# Patient Record
Sex: Male | Born: 1980 | Race: Black or African American | Hispanic: No | Marital: Single | Smoking: Never smoker
Health system: Southern US, Community
[De-identification: ages and names within clinical notes are randomized; demographics above are authoritative.]

## PROBLEM LIST (undated history)

## (undated) DIAGNOSIS — I10 Essential (primary) hypertension: Secondary | ICD-10-CM

## (undated) HISTORY — PX: HERNIA REPAIR: SHX51

---

## 2015-05-09 ENCOUNTER — Emergency Department: Payer: Self-pay

## 2015-05-09 ENCOUNTER — Encounter: Payer: Self-pay | Admitting: *Deleted

## 2015-05-09 ENCOUNTER — Other Ambulatory Visit: Payer: Self-pay

## 2015-05-09 ENCOUNTER — Emergency Department
Admission: EM | Admit: 2015-05-09 | Discharge: 2015-05-09 | Disposition: A | Discharge status: Home / Self Care | Payer: Self-pay | Attending: Student | Admitting: Student

## 2015-05-09 DIAGNOSIS — I10 Essential (primary) hypertension: Secondary | ICD-10-CM | POA: Insufficient documentation

## 2015-05-09 HISTORY — DX: Essential (primary) hypertension: I10

## 2015-05-09 LAB — URINALYSIS COMPLETE WITH MICROSCOPIC (ARMC ONLY)
BACTERIA UA: NONE SEEN
Bilirubin Urine: NEGATIVE
Glucose, UA: NEGATIVE mg/dL
Hgb urine dipstick: NEGATIVE
Leukocytes, UA: NEGATIVE
NITRITE: NEGATIVE
PH: 7 (ref 5.0–8.0)
Protein, ur: NEGATIVE mg/dL
Specific Gravity, Urine: 1.008 (ref 1.005–1.030)
Squamous Epithelial / LPF: NONE SEEN
WBC UA: NONE SEEN WBC/hpf (ref 0–5)

## 2015-05-09 LAB — CBC
HEMATOCRIT: 48 % (ref 40.0–52.0)
HEMOGLOBIN: 14.8 g/dL (ref 13.0–18.0)
MCH: 22.3 pg — ABNORMAL LOW (ref 26.0–34.0)
MCHC: 30.9 g/dL — ABNORMAL LOW (ref 32.0–36.0)
MCV: 72.2 fL — AB (ref 80.0–100.0)
Platelets: 125 10*3/uL — ABNORMAL LOW (ref 150–440)
RBC: 6.64 MIL/uL — ABNORMAL HIGH (ref 4.40–5.90)
RDW: 14.8 % — ABNORMAL HIGH (ref 11.5–14.5)
WBC: 9 10*3/uL (ref 3.8–10.6)

## 2015-05-09 LAB — BASIC METABOLIC PANEL
Anion gap: 8 (ref 5–15)
BUN: 18 mg/dL (ref 6–20)
CALCIUM: 9.6 mg/dL (ref 8.9–10.3)
CHLORIDE: 102 mmol/L (ref 101–111)
CO2: 28 mmol/L (ref 22–32)
CREATININE: 1.38 mg/dL — AB (ref 0.61–1.24)
GFR calc Af Amer: >60 mL/min (ref >60)
Glucose, Bld: 83 mg/dL (ref 65–99)
Potassium: 3.5 mmol/L (ref 3.5–5.1)
Sodium: 138 mmol/L (ref 135–145)

## 2015-05-09 LAB — TROPONIN I: Troponin I: <0.03 ng/mL (ref <0.031)

## 2015-05-09 MED ORDER — HYDROCHLOROTHIAZIDE 12.5 MG PO CAPS: 25.0000 mg | ORAL_CAPSULE | Freq: Every day | ORAL | Status: AC

## 2015-05-09 MED ORDER — HYDRALAZINE HCL 20 MG/ML IJ SOLN
INTRAMUSCULAR | Status: AC
  Filled 2015-05-09: qty 1

## 2015-05-09 MED ORDER — LABETALOL HCL 5 MG/ML IV SOLN
10.0000 mg | Freq: Once | INTRAVENOUS | Status: AC
  Administered 2015-05-09: 10 mg via INTRAVENOUS

## 2015-05-09 MED ORDER — LABETALOL HCL 5 MG/ML IV SOLN
INTRAVENOUS | Status: AC
  Administered 2015-05-09: 10 mg via INTRAVENOUS
  Filled 2015-05-09: qty 4

## 2015-05-09 MED ORDER — HYDRALAZINE HCL 20 MG/ML IJ SOLN: 10.0000 mg | Freq: Once | INTRAMUSCULAR | Status: AC

## 2015-05-09 NOTE — ED Notes (Addendum)
Pt reports he took his blood pressure this morning and it was high (170/120's). Pt denies dizziness, headache, or pain. Pt has hx of hbp but has been off his meds since 2013, stating he was trying to loose weight instead of taking medications.

## 2015-05-09 NOTE — ED Notes (Signed)
Pt resting in bed with no complaints.

## 2015-05-09 NOTE — ED Provider Notes (Signed)
[facility] Emergency Department Provider Note  ____________________________________________  Time seen: Approximately 4:48 PM  I have reviewed the triage vital signs and the nursing notes.   HISTORY  Chief Complaint Hypertension    HPI Verna Hamon is a 34 y.o. male with history of hypertension though he has not been compliant with his antihypertensive medications for several years who presents for evaluation of hypertension. Patient reports that today he was in his usual state of health. There was a blood pressure screening in the cafeteria which he attended and he was told his blood pressure was severely elevated and referred to the emergency department. Currently he reports that he feels well. He denies any current chest pain, difficulty breathing, nausea, vomiting, headache, numbness or weakness, no vision change. He does report that earlier today he had "gas pains close to where my heart is". This was not exacerbated by movement/activity or exertion, not associated with any shortness of breath, nausea, sudden sweating. No modifying factors. Current severity is moderate. He has been hypertensive for several years but to what degree, he is not sure.   Past Medical History  Diagnosis Date  . Hypertension     There are no active problems to display for this patient.   Past Surgical History  Procedure Laterality Date  . Hernia repair      No current outpatient prescriptions on file.  Allergies Review of patient's allergies indicates no known allergies.  No family history on file.  Social History History  Substance Use Topics  . Smoking status: Never Smoker   . Smokeless tobacco: Not on file  . Alcohol Use: No    Review of Systems Constitutional: No fever/chills Eyes: No visual changes. ENT: No sore throat. Cardiovascular: Denies chest pain. Respiratory: Denies shortness of breath. Gastrointestinal: No abdominal pain.  No nausea, no vomiting.   No diarrhea.  No constipation. Genitourinary: Negative for dysuria. Musculoskeletal: Negative for back pain. Skin: Negative for rash. Neurological: Negative for headaches, focal weakness or numbness.  10-point ROS otherwise negative.  ____________________________________________   PHYSICAL EXAM:  VITAL SIGNS: ED Triage Vitals  Enc Vitals Group     BP 05/09/15 1633 189/128 mmHg     Pulse Rate 05/09/15 1633 82     Resp 05/09/15 1633 18     Temp 05/09/15 1633 98.1 F (36.7 C)     Temp Source 05/09/15 1633 Oral     SpO2 05/09/15 1633 100 %     Weight 05/09/15 1633 272 lb (123.378 kg)     Height 05/09/15 1633  (1.803 m)     Head Cir --      Peak Flow --      Pain Score --      Pain Loc --      Pain Edu? --      Excl. in GC? --     Constitutional: Alert and oriented. Well appearing and in no acute distress. Eyes: Conjunctivae are normal. PERRL. EOMI. Head: Atraumatic. Nose: No congestion/rhinnorhea. Mouth/Throat: Mucous membranes are moist.  Oropharynx non-erythematous. Neck: No stridor.  Cardiovascular: Normal rate, regular rhythm. Grossly normal heart sounds.  Good peripheral circulation. Respiratory: Normal respiratory effort.  No retractions. Lungs CTAB. Gastrointestinal: Soft and nontender. No distention. No abdominal bruits. No CVA tenderness. Genitourinary: deferred Musculoskeletal: No lower extremity tenderness nor edema.  No joint effusions. Neurologic:  Normal speech and language. No gross focal neurologic deficits are appreciated. Speech is normal. No gait instability. 5 out of 5 strength in  bilateral upper and lower extremities. Sensation intact to light touch throughout. Cranial nerves II through XII intact. Intact finger-nose-finger. Skin:  Skin is warm, dry and intact. No rash noted. Psychiatric: Mood and affect are normal. Speech and behavior are normal.  ____________________________________________   LABS (all labs ordered are listed, but only  abnormal results are displayed)  Labs Reviewed  CBC - Abnormal; Notable for the following:    RBC 6.64 (*)    MCV 72.2 (*)    MCH 22.3 (*)    MCHC 30.9 (*)    RDW 14.8 (*)    Platelets 125 (*)    All other components within normal limits  BASIC METABOLIC PANEL  URINALYSIS COMPLETEWITH MICROSCOPIC ([facility] ONLY)  TROPONIN I   ____________________________________________  EKG  ED ECG REPORT I, Gayla DossGayle, Serenidy Waltz A, the attending physician, personally viewed and interpreted this ECG.   Date: 05/09/2015  EKG Time: 16:45  Rate: 75  Rhythm: Sinus rhythm 1st degree AV block  Axis: Normal  Intervals:none  ST&T Change: No acute ST segment change  ____________________________________________  RADIOLOGY  CXR IMPRESSION: Borderline cardiomegaly without acute cardiopulmonary disease on this AP portable examination. Further evaluation with a PA and lateral chest radiograph may be obtained as clinically indicated.  ____________________________________________   PROCEDURES  Procedure(s) performed: None  Critical Care performed: No  ____________________________________________   INITIAL IMPRESSION / ASSESSMENT AND PLAN / ED COURSE  Pertinent labs & imaging results that were available during my care of the patient were reviewed by me and considered in my medical decision making (see chart for details).  Melinda CrutchJames Shearon is a 34 y.o. male with history of hypertension though he has not been compliant with his antihypertensive medications for several years who presents for evaluation of hypertension, currently asymptomatic. On exam, he is very well-appearing. He is sitting up in bed, talkative, in no distress. He is severely hypertensive on arrival. We will obtain screening labs, chest x-ray, EKG, and give hydralazine to reduce his blood pressure. Reassess for disposition.  ----------------------------------------- 7:42 PM on 05/09/2015 ----------------------------------------- Troponin  negative. EKG reassuring. Chest pain is not consistent with ACS. No evidence of end organ dysfunction in terms of interstitial edema. Creatinine is 1.38, baseline unknown. Blood pressure has improved to 164/108. Will start on HCTZ. Discussed need for medication compliance and close PCP follow-up. He will find a primary care doctor in his area. DC with return precautions.  ____________________________________________   FINAL CLINICAL IMPRESSION(S) / ED DIAGNOSES  Final diagnoses:  Essential hypertension      Gayla DossEryka A Fleming Prill, MD 05/09/15 [phone]

## 2015-05-09 NOTE — Discharge Instructions (Signed)
Return immediately to the emergency department if you develop recurrent/severe/worsening chest pain, difficulty breathing, sudden sweating, nausea or vomiting, fevers, headache, numbness or weakness, confusion, vision changes, speech difficulties or for any other concerns.

## 2016-01-24 IMAGING — CR DG CHEST 1V
1 series · 1 of 1 positions shown · non-contrast
Comparison: None.

CLINICAL DATA: Mid to left-sided chest pain which radiates to the
neck. Elevated blood pressure.

EXAM:
CHEST  1 VIEW

[portable]
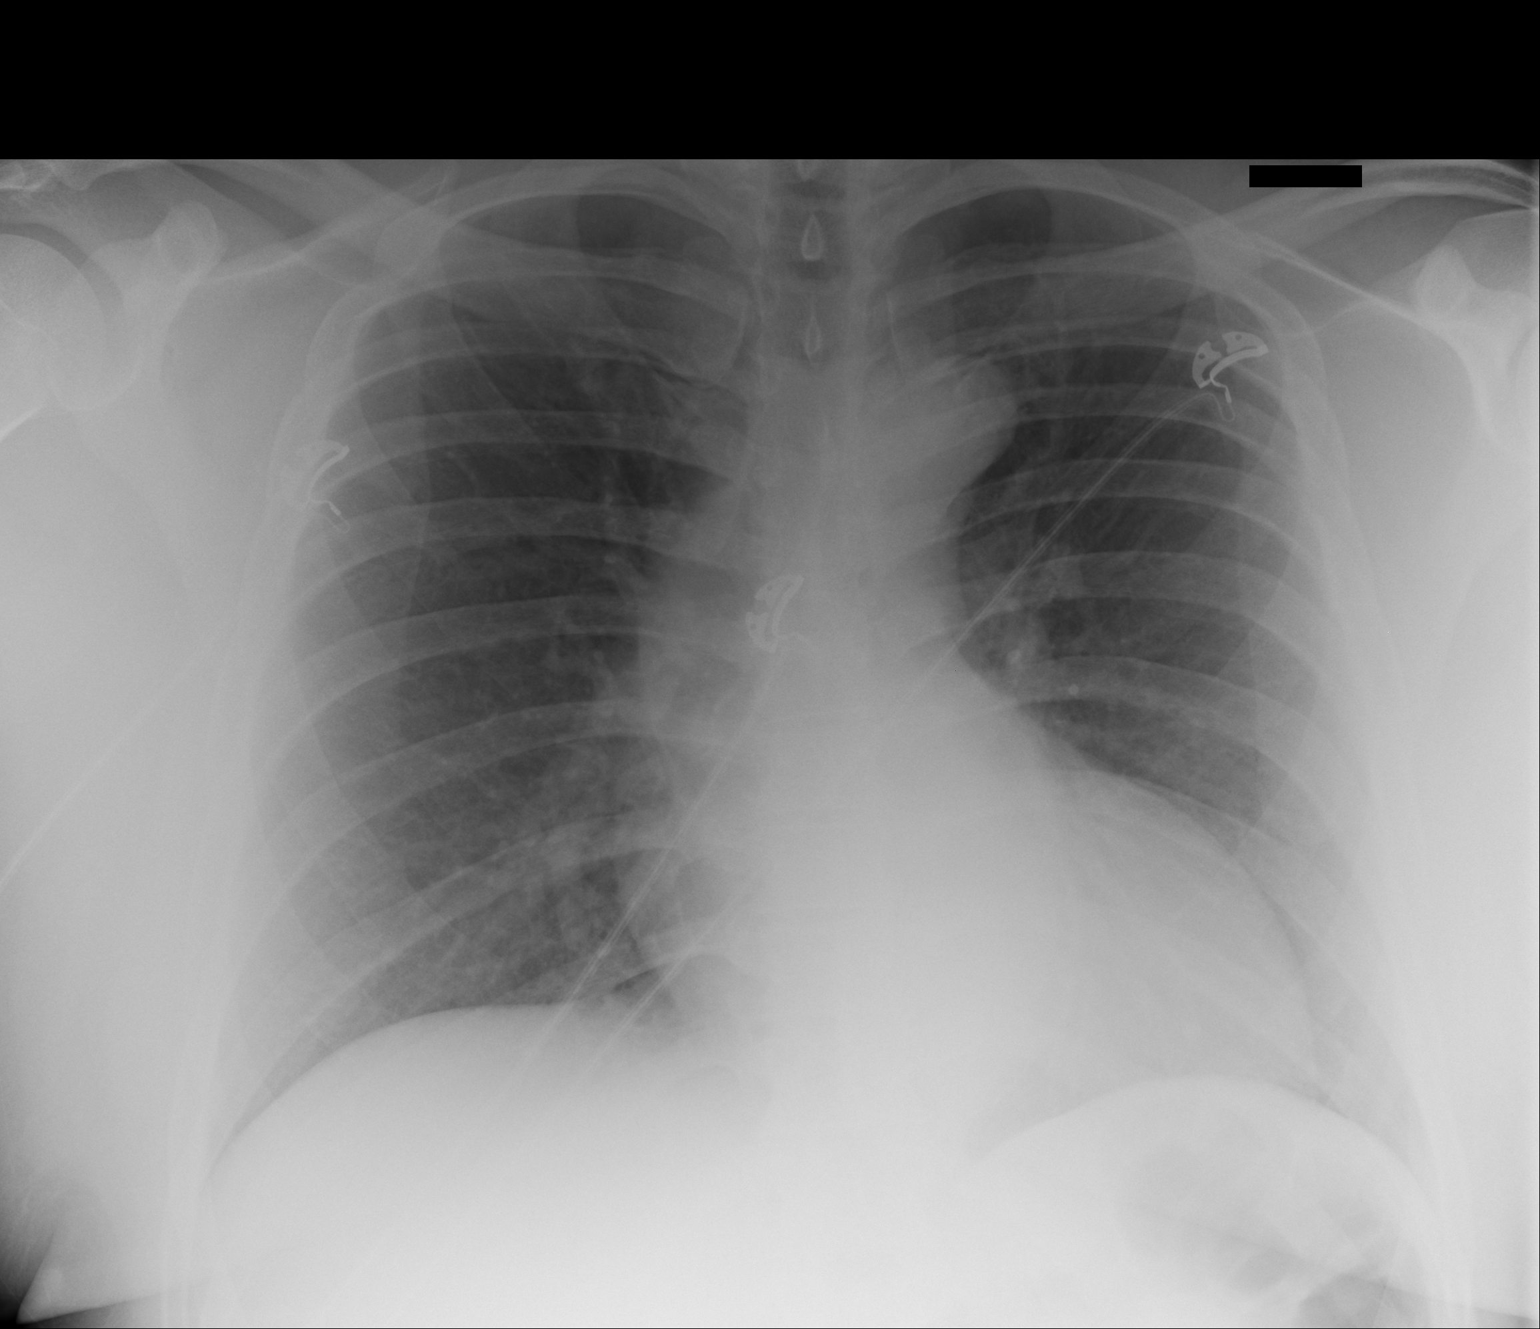

[1 of 1 positions shown; findings below may reference images not displayed]

FINDINGS: Borderline enlarged cardiac silhouette and mediastinal contours with
mild tortuosity of the thoracic aorta, potentially accentuated due
to hypoventilation and AP projection. No focal airspace opacities.
No pleural effusion or pneumothorax. No evidence of edema. No acute
osseus abnormalities.
IMPRESSION: Borderline cardiomegaly without acute cardiopulmonary disease on
this AP portable examination. Further evaluation with a PA and
lateral chest radiograph may be obtained as clinically indicated.
